# Patient Record
Sex: Male | Born: 1995 | Hispanic: No | Marital: Single | Smoking: Current every day smoker
Health system: Southern US, Community
[De-identification: ages and names within clinical notes are randomized; demographics above are authoritative.]

---
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
## 2018-07-08 ENCOUNTER — Emergency Department (HOSPITAL_COMMUNITY)
Admission: EM | Admit: 2018-07-08 | Discharge: 2018-07-08 | Disposition: A | Discharge status: Home / Self Care | Payer: Medicaid Other | Attending: Emergency Medicine | Admitting: Emergency Medicine

## 2018-07-08 ENCOUNTER — Encounter (HOSPITAL_COMMUNITY): Payer: Self-pay | Admitting: *Deleted

## 2018-07-08 DIAGNOSIS — L509 Urticaria, unspecified: Secondary | ICD-10-CM | POA: Insufficient documentation

## 2018-07-08 DIAGNOSIS — F17210 Nicotine dependence, cigarettes, uncomplicated: Secondary | ICD-10-CM | POA: Insufficient documentation

## 2018-07-08 DIAGNOSIS — R21 Rash and other nonspecific skin eruption: Secondary | ICD-10-CM | POA: Diagnosis present

## 2018-07-08 MED ORDER — EPINEPHRINE 0.3 MG/0.3ML IJ SOAJ: 0.3000 mg | Freq: Once | INTRAMUSCULAR | 0 refills | Status: AC

## 2018-07-08 MED ORDER — FAMOTIDINE 20 MG PO TABS
20.0000 mg | ORAL_TABLET | Freq: Once | ORAL | Status: AC
Start: 2018-07-08 — End: 2018-07-08
  Administered 2018-07-08: 20 mg via ORAL
  Filled 2018-07-08: qty 1

## 2018-07-08 MED ORDER — DIPHENHYDRAMINE HCL 25 MG PO CAPS
50.0000 mg | ORAL_CAPSULE | Freq: Once | ORAL | Status: AC
Start: 2018-07-08 — End: 2018-07-08
  Administered 2018-07-08: 50 mg via ORAL
  Filled 2018-07-08: qty 2

## 2018-07-08 MED ORDER — DIPHENHYDRAMINE HCL 25 MG PO TABS: 25.0000 mg | ORAL_TABLET | Freq: Four times a day (QID) | ORAL | 0 refills | Status: AC

## 2018-07-08 MED ORDER — PREDNISONE 20 MG PO TABS
60.0000 mg | ORAL_TABLET | Freq: Once | ORAL | Status: AC
  Administered 2018-07-08: 60 mg via ORAL
  Filled 2018-07-08: qty 3

## 2018-07-08 NOTE — ED Notes (Signed)
Pt verbalizes understanding of d/c instructions. Pt received prescriptions. Pt ambulatory at d/c with all belongings.  

## 2018-07-08 NOTE — ED Notes (Signed)
Pt stated that he feels better after meds that was givin

## 2018-07-08 NOTE — ED Provider Notes (Signed)
Patient placed in Quick Look pathway, seen and evaluated   Chief Complaint: Rash, scratchy throat  HPI:   Patient presents with rash, he is noticed some welts over his right wrist as well as the left side of his neck and across his face, he has not noticed any facial swelling, swelling of the tongue or lips, but reports his throat felt a little bit scratchy.  No difficulty breathing.  Notes a red place on his leg where he thinks he was stung by a bee at work.  No prior history of anaphylaxis, no meds prior to arrival.  ROS: + rash, sore throat. - Facial swelling, shortness of breath, lightheadedness Physical Exam:   Gen: No distress  Neuro: Awake and Alert  Skin: Warm    Focused Exam: Urticaria over the right wrist and left side of the neck, no facial swelling noted, posterior oropharynx is clear, no angioedema of the tongue or lips, no stridor, lungs clear to auscultation, normal phonation.  Erythema over the right inner ankle   Initiation of care has begun. The patient has been counseled on the process, plan, and necessity for staying for the completion/evaluation, and the remainder of the medical screening examination   Legrand RamsFord, Tajae Rybicki N, PA-C 07/08/18 1715    Azalia Bilisampos, Kevin, MD 07/08/18 2337

## 2018-07-08 NOTE — ED Triage Notes (Signed)
Pt reports being stung by a bee today at work st 14:20  with no known hx of allergies to bee stings and then had rash to face and arms onset today @ 14:50, pt denies SOB, pt airway intact, pt A&O x4

## 2018-07-08 NOTE — ED Provider Notes (Signed)
[facility] Provider Note   CSN: [id number] Arrival date & time: 07/08/18  1615   History   Chief Complaint Chief Complaint  Patient presents with  . Rash    HPI Thomas Morris is a 22 y.o. male.  HPI   22 year old male presents today with complaints of  Bee sting. Thomas Morris reports that he was at work today when a bee stung him on his right lower extremity. He notes swelling over the area, with a rash to his right wrist and tightness in his throat. He denies any difficulty breathing or swallowing. Patient notes he has been stung the past by bees and did not have an allergic reaction. Patient denies any other allergies. No medications prior to arrival.    History reviewed. No pertinent past medical history.  There are no active problems to display for this patient.   No past surgical history on file.      Home Medications    Prior to Admission medications   Medication Sig Start Date End Date Taking? Authorizing Provider  cephALEXin (KEFLEX) 500 MG capsule Take 1 capsule (500 mg total) by mouth 3 (three) times daily. For 7 days 03/17/14   Ree Shayeis, Jamie, MD  diphenhydrAMINE (BENADRYL) 25 MG tablet Take 1 tablet (25 mg total) by mouth every 6 (six) hours. 07/08/18   Erie Sica, Tinnie GensJeffrey, PA-C  EPINEPHrine (EPIPEN 2-PAK) 0.3 mg/0.3 mL IJ SOAJ injection Inject 0.3 mLs (0.3 mg total) into the muscle once for 1 dose. 07/08/18 07/08/18  Eyvonne MechanicHedges, Amberlee Garvey, PA-C    Family History No family history on file.  Social History Social History   Tobacco Use  . Smoking status: Current Every Day Smoker    Packs/day: 0.10    Types: Cigarettes  . Smokeless tobacco: Never Used  Substance Use Topics  . Alcohol use: Not Currently  . Drug use: Never     Allergies   Patient has no known allergies.   Review of Systems Review of Systems  All other systems reviewed and are negative.  Physical Exam Updated Vital Signs BP 127/64 (BP Location: Right  Arm)   Pulse 84   Temp 98.7 F (37.1 C) (Oral)   Resp 14   SpO2 97%   Physical Exam  Constitutional: He is oriented to person, place, and time. He appears well-developed and well-nourished.  HENT:  Head: Normocephalic and atraumatic.  Oropharynx is clear with no swelling or edema, no pooling of secretions  Eyes: Pupils are equal, round, and reactive to light. Conjunctivae are normal. Right eye exhibits no discharge. Left eye exhibits no discharge. No scleral icterus.  Neck: Normal range of motion. No JVD present. No tracheal deviation present.  Pulmonary/Chest: Effort normal and breath sounds normal. No stridor. No respiratory distress. He has no wheezes. He has no rales.  Musculoskeletal:  Hive noted over the right volar wrist, time noted over the right anterior shin, no stingers noted  Neurological: He is alert and oriented to person, place, and time. Coordination normal.  Psychiatric: He has a normal mood and affect. His behavior is normal. Judgment and thought content normal.  Nursing note and vitals reviewed.   ED Treatments / Results  Labs (all labs ordered are listed, but only abnormal results are displayed) Labs Reviewed - No data to display  EKG None  Radiology No results found.  Procedures Procedures (including critical care time)  Medications Ordered in ED Medications  diphenhydrAMINE (BENADRYL) capsule 50 mg (50 mg Oral Given 07/08/18  1709)  famotidine (PEPCID) tablet 20 mg (20 mg Oral Given 07/08/18 1709)  predniSONE (DELTASONE) tablet 60 mg (60 mg Oral Given 07/08/18 1709)     Initial Impression / Assessment and Plan / ED Course  I have reviewed the triage vital signs and the nursing notes.  Pertinent labs & imaging results that were available during my care of the patient were reviewed by me and considered in my medical decision making (see chart for details).     Labs:   Imaging:  Consults:  Therapeutics:Benadryl, Pepcid, prednisone  Discharge  Meds: Benadryl, EpiPen  Assessment/Plan: 22 year old male presents today with hives. No signs of anaphylactic reaction here. No history of allergic reaction to this. Patient discharged home on antihistamines. Patient will go home and shower cleansing the area of insect bite. He is given an EpiPen, I had discussion with him on appropriate use of epinephrine    Final Clinical Impressions(s) / ED Diagnoses   Final diagnoses:  Urticaria    ED Discharge Orders        Ordered    diphenhydrAMINE (BENADRYL) 25 MG tablet  Every 6 hours     07/08/18 2004    EPINEPHrine (EPIPEN 2-PAK) 0.3 mg/0.3 mL IJ SOAJ injection   Once     07/08/18 2005       Eyvonne Mechanic, Cordelia Poche 07/08/18 2144    Sabas Sous, MD 07/08/18 [phone]

## 2018-07-08 NOTE — Discharge Instructions (Addendum)
Please read attached information. If you experience any new or worsening signs or symptoms please return to the emergency room for evaluation. Please follow-up with your primary care provider or specialist as discussed. Please use medication prescribed only as directed and discontinue taking if you have any concerning signs or symptoms.   °

## 2018-08-02 ENCOUNTER — Emergency Department (HOSPITAL_COMMUNITY)
Admission: EM | Admit: 2018-08-02 | Discharge: 2018-08-03 | Disposition: A | Discharge status: Home / Self Care | Payer: Medicaid Other | Attending: Emergency Medicine | Admitting: Emergency Medicine

## 2018-08-02 ENCOUNTER — Other Ambulatory Visit: Payer: Self-pay

## 2018-08-02 ENCOUNTER — Emergency Department (HOSPITAL_COMMUNITY): Payer: Medicaid Other

## 2018-08-02 ENCOUNTER — Encounter (HOSPITAL_COMMUNITY): Payer: Self-pay | Admitting: Emergency Medicine

## 2018-08-02 DIAGNOSIS — R1110 Vomiting, unspecified: Secondary | ICD-10-CM | POA: Diagnosis not present

## 2018-08-02 DIAGNOSIS — Y92310 Basketball court as the place of occurrence of the external cause: Secondary | ICD-10-CM | POA: Diagnosis not present

## 2018-08-02 DIAGNOSIS — H538 Other visual disturbances: Secondary | ICD-10-CM | POA: Diagnosis not present

## 2018-08-02 DIAGNOSIS — Y998 Other external cause status: Secondary | ICD-10-CM | POA: Diagnosis not present

## 2018-08-02 DIAGNOSIS — S0232XA Fracture of orbital floor, left side, initial encounter for closed fracture: Secondary | ICD-10-CM

## 2018-08-02 DIAGNOSIS — W500XXA Accidental hit or strike by another person, initial encounter: Secondary | ICD-10-CM | POA: Diagnosis not present

## 2018-08-02 DIAGNOSIS — F17210 Nicotine dependence, cigarettes, uncomplicated: Secondary | ICD-10-CM | POA: Diagnosis not present

## 2018-08-02 DIAGNOSIS — Y9367 Activity, basketball: Secondary | ICD-10-CM | POA: Diagnosis not present

## 2018-08-02 DIAGNOSIS — S01112A Laceration without foreign body of left eyelid and periocular area, initial encounter: Secondary | ICD-10-CM | POA: Diagnosis not present

## 2018-08-02 DIAGNOSIS — S0993XA Unspecified injury of face, initial encounter: Secondary | ICD-10-CM | POA: Diagnosis present

## 2018-08-02 MED ORDER — OXYCODONE-ACETAMINOPHEN 5-325 MG PO TABS
1.0000 | ORAL_TABLET | ORAL | Status: DC | PRN
  Administered 2018-08-02: 1 via ORAL
  Filled 2018-08-02: qty 1

## 2018-08-02 NOTE — ED Provider Notes (Signed)
Patient placed in Quick Look pathway, seen and evaluated   Chief Complaint: eye pain  HPI:   Patient was elbowed in the left eye while playing basketball.  He reports that he has a cut to his left eye.  He reports that his vision is not normal.  ROS: No headache or LOC  Physical Exam:   Gen: No distress  Neuro: Awake and Alert  Skin: Warm    Focused Exam: 2cm laceration present on the upper left eyelid, does not appear to be through the lid.  When I is opened there is redness on the medial cornea.  Patient unable to participate in extraocular range of motion's testing secondary to pain.  He is able to count fingers.  Last tetanus 03/2014   Initiation of care has begun. The patient has been counseled on the process, plan, and necessity for staying for the completion/evaluation, and the remainder of the medical screening examination    Norman ClayHammond, Shi Blankenship W, PA-C 08/02/18 1914    Raeford RazorKohut, Stephen, MD 08/08/18 1430
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
## 2018-08-03 ENCOUNTER — Encounter (HOSPITAL_COMMUNITY): Payer: Self-pay | Admitting: Student

## 2018-08-03 MED ORDER — OXYCODONE-ACETAMINOPHEN 5-325 MG PO TABS: 1.0000 | ORAL_TABLET | Freq: Four times a day (QID) | ORAL | 0 refills | Status: AC | PRN

## 2018-08-03 MED ORDER — TETRACAINE HCL 0.5 % OP SOLN
2.0000 [drp] | Freq: Once | OPHTHALMIC | Status: AC
  Administered 2018-08-03: 2 [drp] via OPHTHALMIC
  Filled 2018-08-03: qty 4

## 2018-08-03 MED ORDER — LIDOCAINE HCL (PF) 1 % IJ SOLN
5.0000 mL | Freq: Once | INTRAMUSCULAR | Status: AC
  Administered 2018-08-03: 5 mL
  Filled 2018-08-03: qty 5

## 2018-08-03 MED ORDER — FLUORESCEIN SODIUM 1 MG OP STRP
1.0000 | ORAL_STRIP | Freq: Once | OPHTHALMIC | Status: AC
Start: 2018-08-03 — End: 2018-08-03
  Administered 2018-08-03: 1 via OPHTHALMIC
  Filled 2018-08-03: qty 1

## 2018-08-03 NOTE — Discharge Instructions (Addendum)
You were seen in the emergency department today for an injury to your eye.  Your CT scan does show a fracture of the lower portion of your eye bone, otherwise noticed an orbital floor fracture.  The laceration to the upper portion of your eyelid was repaired with 3 stitches, these are absorbable, they will absorb on their own and will not need to be removed.  We would like you to follow-up with an ear nose and throat doctor who specializes in these types of fractures.  Please call the office providing her discharge instructions on Monday to set up an appointment within the next few days.  Return to the ER sooner for new or worsening symptoms including but not limited to inability to move your eyes, purulent drainage from the eye, increased pain, or change in vision, purulent drainage from the laceration, redness around the laceration, fever, significant swelling, vomiting, or any other concerns that you may have.  We have given you a prescription for Percocet for severe pain. -Percocet-this is a narcotic/controlled substance medication that has potential addicting qualities.  We recommend that you take 1-2 tablets every 6 hours as needed for severe pain.  Do not drive or operate heavy machinery when taking this medicine as it can be sedating. Do not drink alcohol or take other sedating medications when taking this medicine for safety reasons.  Keep this out of reach of small children.  Please be aware this medicine has Tylenol in it (325 mg/tab) do not exceed the maximum dose of Tylenol in a day per over the counter recommendations should you decide to supplement with Tylenol over the counter.   We have prescribed you new medication(s) today. Discuss the medications prescribed today with your pharmacist as they can have adverse effects and interactions with your other medicines including over the counter and prescribed medications. Seek medical evaluation if you start to experience new or abnormal symptoms after  taking one of these medicines, seek care immediately if you start to experience difficulty breathing, feeling of your throat closing, facial swelling, or rash as these could be indications of a more serious allergic reaction

## 2018-08-03 NOTE — ED Notes (Signed)
PA at bedside performing eye examination .

## 2018-08-03 NOTE — ED Provider Notes (Signed)
[facility] Provider Note   CSN: [id number] Arrival date & time: 08/02/18  1858     History   Chief Complaint Chief Complaint  Patient presents with  . Eye Pain  . Facial Swelling    HPI Thomas Morris is a 22 y.o. male with a hx of tobacco abuse who presents to the ED for L eye injury which occurred around 1400 this afternoon.  Patient states that he was playing basketball when another player accidentally elbowed him in the left eye.  He had fairly quick onset of pain, pain is most prominent to the eye and just below the eye.  Current discomfort is an 8 out of 10 in severity.  He was given Percocet in triage, however he has not had anything to eat today, and resultantly had an episode of emesis very shortly after.  Patient reports associated laceration to the left eyelid as well as some mild blurry vision in the left eye.  There are no specific alleviating or aggravating factors.  He did not have any loss of consciousness.  He is not a contact lens wearer.  He was not feeling nauseous or having vomiting until having Percocet, no other episodes of emesis.  Denies fever, numbness, weakness, loss of consciousness.  He reports last tetanus vaccination was in April of this year.    HPI  History reviewed. No pertinent past medical history.  There are no active problems to display for this patient.   History reviewed. No pertinent surgical history.      Home Medications    Prior to Admission medications   Medication Sig Start Date End Date Taking? Authorizing Provider  cephALEXin (KEFLEX) 500 MG capsule Take 1 capsule (500 mg total) by mouth 3 (three) times daily. For 7 days 03/17/14   Ree Shayeis, Jamie, MD  diphenhydrAMINE (BENADRYL) 25 MG tablet Take 1 tablet (25 mg total) by mouth every 6 (six) hours. 07/08/18   Eyvonne MechanicHedges, Jeffrey, PA-C    Family History No family history on file.  Social History Social History   Tobacco Use  . Smoking  status: Current Every Day Smoker    Packs/day: 0.10    Types: Cigarettes  . Smokeless tobacco: Never Used  Substance Use Topics  . Alcohol use: Not Currently  . Drug use: Never     Allergies   Patient has no known allergies.   Review of Systems Review of Systems  All other systems reviewed and are negative.    Physical Exam Updated Vital Signs BP 123/63 (BP Location: Right Arm)   Pulse 70   Temp 98.7 F (37.1 C) (Oral)   Resp 14   Ht 5\' 7"  (1.702 m)   Wt 78.5 kg   SpO2 100%   BMI 27.10 kg/m   Physical Exam  Constitutional: He is oriented to person, place, and time. He appears well-developed and well-nourished. No distress.  HENT:  Right Ear: Tympanic membrane normal. No drainage. No hemotympanum.  Left Ear: Tympanic membrane normal. No drainage. No hemotympanum.  Nose: Nose normal. No mucosal edema, nasal deformity, septal deviation or nasal septal hematoma. No epistaxis.  Mouth/Throat: Uvula is midline and oropharynx is clear and moist.  Eyes: Right eye exhibits no discharge. Left eye exhibits no discharge.  Patient exhibits left periorbital swelling and ecchymosis.  There is a fairly superficial area of abrasion to the inferior periorbital region.  Patient has a 2.25 cm laceration just inferior to the left brow on the lid, fairly  superficial, does not appear to go through the lid.  Patient is tender to the inferior periorbital area.  No other areas of facial tenderness to palpation.  His pupils are equal round and reactive to light.  His extraocular movements are intact without nystagmus.  He does have a medial left conjunctival hemorrhage.  No appreciable hyphema or hypopyon.  No significant fluorescein uptake on exam to raise concern for corneal abrasion. Negative seidel sign.  His IOP in the left eye is 16. Visual acuity: Bilateral: 10/12.5  R Near: 10/12.5 L Near: 10/12.5  Cardiovascular: Normal rate and regular rhythm.  No murmur heard. Pulmonary/Chest: Effort  normal and breath sounds normal. No respiratory distress.  Abdominal: Soft. He exhibits no distension.  Neurological: He is alert and oriented to person, place, and time.  Clear speech.   Psychiatric: He has a normal mood and affect. His behavior is normal. Thought content normal.  Nursing note and vitals reviewed.    ED Treatments / Results  Labs (all labs ordered are listed, but only abnormal results are displayed) Labs Reviewed - No data to display  EKG None  Radiology Ct Orbitss W/o Cm  Result Date: 08/02/2018 CLINICAL DATA:  Elbowed in the left orbital region while playing basketball. Pain with movement of the left eye. EXAM: CT ORBITS WITHOUT CONTRAST TECHNIQUE: Multidetector CT images were obtained using the standard protocol without intravenous contrast. COMPARISON:  None. FINDINGS: Orbits: There is a fracture of the floor of the left orbit with a trapdoor fragment flipped medially on image 29/5, and with herniation of orbital adipose tissue and partial herniation of the inferior margin of the inferior rectus muscle into the resulting defect as shown on image 56/8 and image 28/7. No discrete fracture of the orbital rim. As expected, there is a small amount of fluid in the left maxillary sinus. The zygomatic arch is intact. Visualized sinuses: There is opacification of a inferolateral ethmoid air cell adjacent to the orbital fracture as shown on image 29/5. As noted above there is some fluid in the left maxillary sinus which is not unexpected due to the fracture. The paranasal sinuses appear otherwise clear. Soft tissues: Left periorbital and in particular infraorbital subcutaneous hematoma/soft tissue swelling. Limited intracranial: Unremarkable IMPRESSION: 1. Left orbital floor fracture with trapdoor fragment. Mild herniation of orbital adipose tissue and partial herniation of the inferior rectus muscle into the resulting defect. There is a small amount of blood in the left maxillary  sinus and potentially in an adjacent left ethmoid air cell. No appreciable fracture of the orbital rim or of the zygomatic arch. 2. Left periorbital subcutaneous hematoma/soft tissue swelling. Electronically Signed   By: Gaylyn Rong M.D.   On: 08/02/2018 19:51    Procedures .Marland KitchenLaceration Repair Date/Time: 08/03/2018 3:22 AM Performed by: Cherly Anderson, PA-C Authorized by: Cherly Anderson, PA-C   Consent:    Consent obtained:  Verbal   Consent given by:  Patient   Risks discussed:  Infection, nerve damage, need for additional repair, pain, poor cosmetic result, poor wound healing, vascular damage, tendon damage and retained foreign body   Alternatives discussed:  No treatment Anesthesia (see MAR for exact dosages):    Anesthesia method:  Local infiltration   Local anesthetic:  Lidocaine 1% w/o epi Laceration details:    Location:  Face   Face location:  L upper eyelid   Extent:  Partial thickness   Length (cm):  2.3 Repair type:    Repair type:  Simple Pre-procedure  details:    Preparation:  Patient was prepped and draped in usual sterile fashion and imaging obtained to evaluate for foreign bodies Exploration:    Hemostasis achieved with:  Direct pressure   Wound exploration: wound explored through full range of motion and entire depth of wound probed and visualized     Contaminated: no   Treatment:    Area cleansed with:  Betadine   Amount of cleaning:  Standard   Irrigation solution:  Sterile water   Irrigation method:  Pressure wash Skin repair:    Repair method:  Sutures   Suture size:  5-0   Wound skin closure material used: vicryl rapide.   Suture technique:  Simple interrupted   Number of sutures:  3 Approximation:    Approximation:  Close Post-procedure details:    Dressing:  Antibiotic ointment   Patient tolerance of procedure:  Tolerated well, no immediate complications   (including critical care time)  Medications Ordered in  ED Medications  oxyCODONE-acetaminophen (PERCOCET/ROXICET) 5-325 MG per tablet 1 tablet (1 tablet Oral Given 08/02/18 2031)  fluorescein ophthalmic strip 1 strip (1 strip Left Eye Given 08/03/18 0125)  tetracaine (PONTOCAINE) 0.5 % ophthalmic solution 2 drop (2 drops Left Eye Given 08/03/18 0124)     Initial Impression / Assessment and Plan / ED Course  I have reviewed the triage vital signs and the nursing notes.  Pertinent labs & imaging results that were available during my care of the patient were reviewed by me and considered in my medical decision making (see chart for details).   Patient presents to the emergency department status post left eye trauma with complaints of discomfort, laceration, and mild blurry vision.  Patient nontoxic-appearing, vitals upon my assessment within normal limits.  Resting comfortably.  Detailed exam as above.  CT scan of the orbits per triage reveals left orbital floor fracture with trapdoor fragment, there is mild herniation of orbital adipose tissue and partial herniation of inferior rectus muscle into the resulting defect.  Additionally small amount of blood in the left maxillary sinus and potentially in the adjacent left ethmoid air cells.  Patient's extraocular movements are intact, no findings to raise concern for entrapment on exam.  Physical exam findings do not raise concern for globe rupture.  No evidence of corneal abrasion or hyphema. Visual acuity symmetric. IOP WNL. Patient does have a subconjunctival hemorrhage. Laceration is not directly over fracture. No exposed bone. Laceration was repaired per procedure note above, his tetanus is up-to-date, suture home care discussed with the patient and his mother at bedside.Patient will require follow-up with maxillofacial trauma service, [facility] ENT covering. I discussed results, treatment plan, need for follow-up, and strict return precautions with the patient. Provided opportunity for questions, patient  confirmed understanding and is in agreement with plan.   Findings and plan of care discussed with supervising physician Dr. Preston Fleeting who is in agreement  Final Clinical Impressions(s) / ED Diagnoses   Final diagnoses:  Closed fracture of left orbital floor, initial encounter [facility])  Left eyelid laceration, initial encounter    ED Discharge Orders         Ordered    oxyCODONE-acetaminophen (PERCOCET/ROXICET) 5-325 MG tablet  Every 6 hours PRN     08/03/18 0331           Cherly Anderson, PA-C 08/03/18 0704    Dione Booze, MD 08/03/18 [phone]

## 2019-04-06 IMAGING — CT CT ORBITS W/O CM
3 of 4 series · 14 of 47 positions shown, 16 images · non-contrast
Comparison: None.

CLINICAL DATA: Elbowed in the left orbital region while playing
basketball. Pain with movement of the left eye.

EXAM:
CT ORBITS WITHOUT CONTRAST
TECHNIQUE: Multidetector CT images were obtained using the standard protocol
without intravenous contrast.

[Series 3: facialbone 2.0 st · axial · 0.32mm/px · z∈[+985,+1079]mm · 8 of 55 slices shown, 10 images]
[im 4/55  brain]
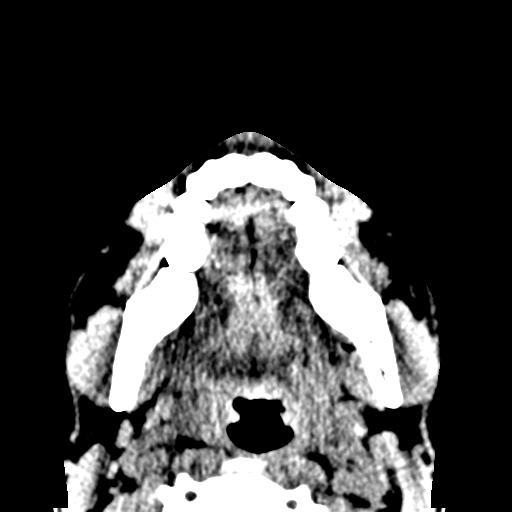
[im 4/55  bone]
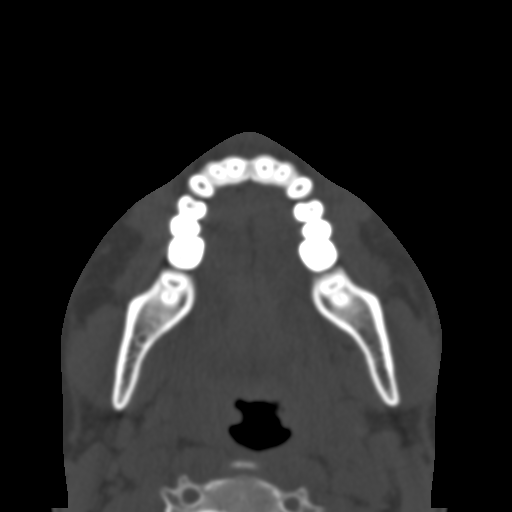
[im 12/55  bone]
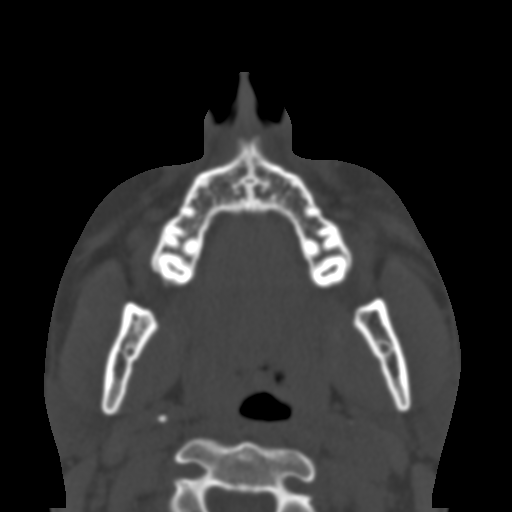
[im 17/55  bone]
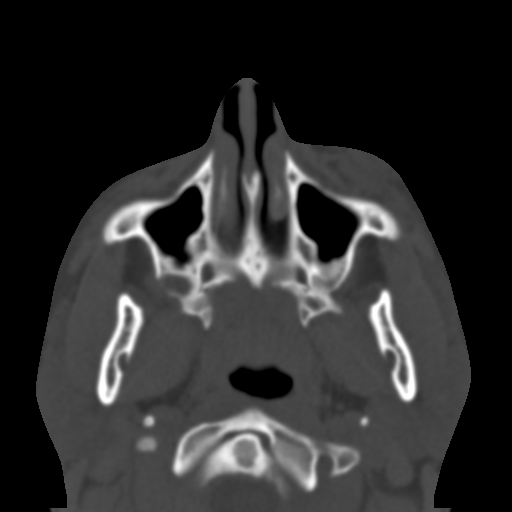
[im 25/55  bone]
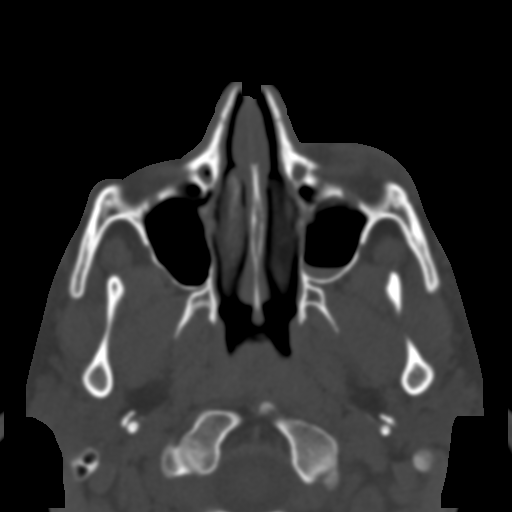
[im 30/55  brain]
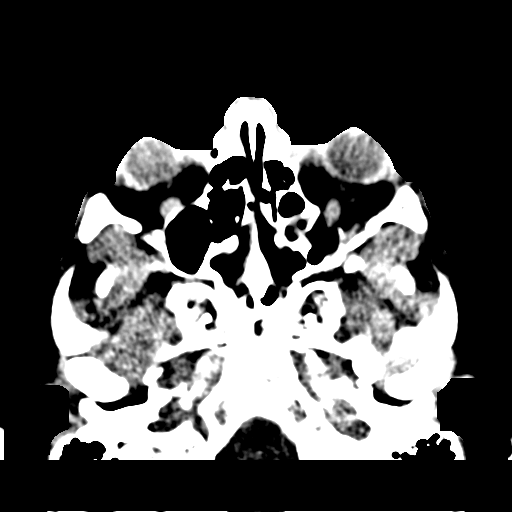
[im 30/55  bone]
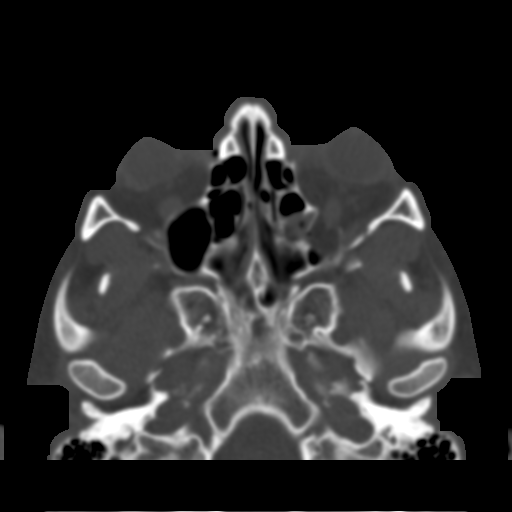
[im 38/55  bone]
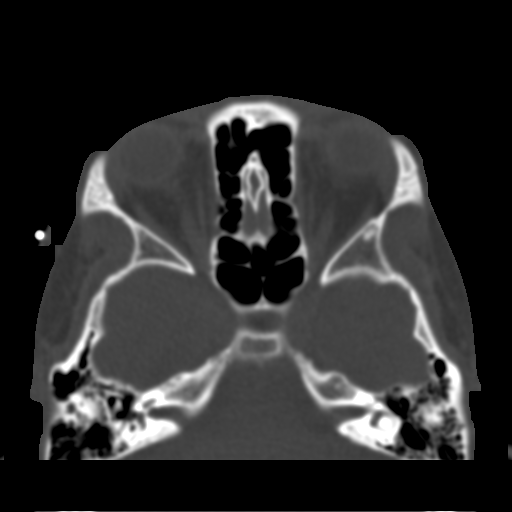
[im 43/55  bone]
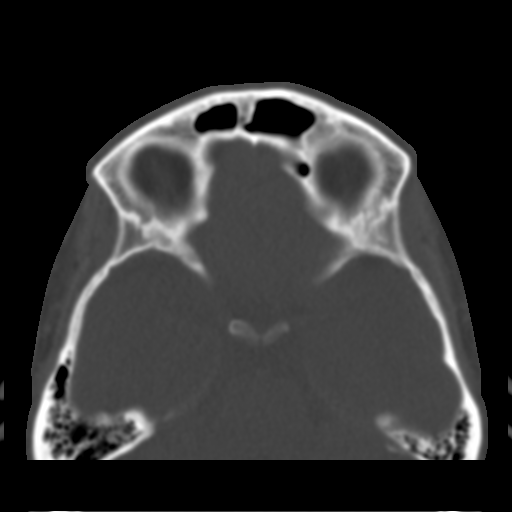
[im 51/55  bone]
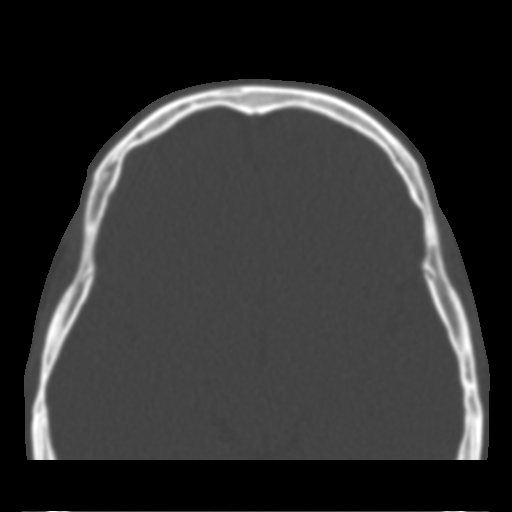

[Series 7: facialbone 2.0 cor st · coronal · 0.23mm/px · 3 of 72 slices shown]
[im 18/72  bone]
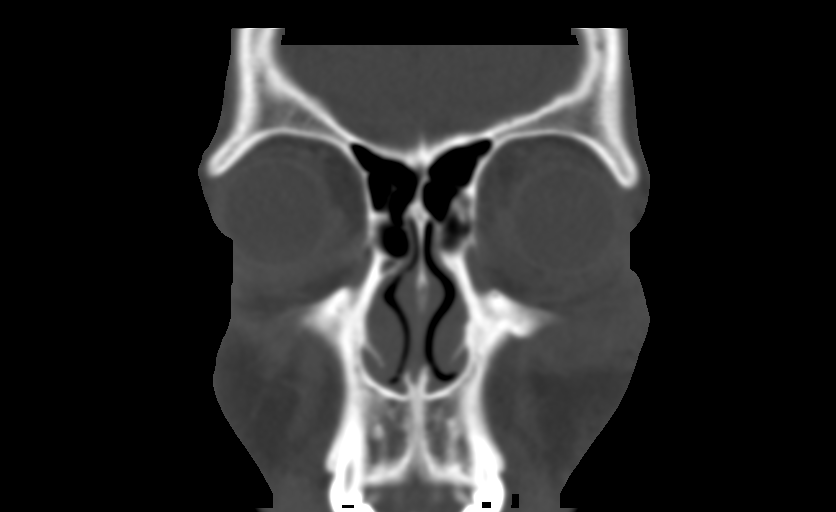
[im 36/72  bone]
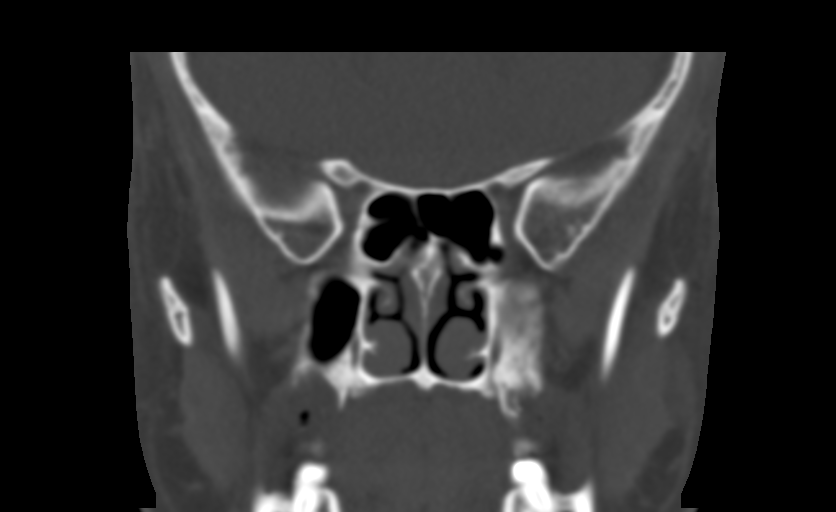
[im 54/72  bone]
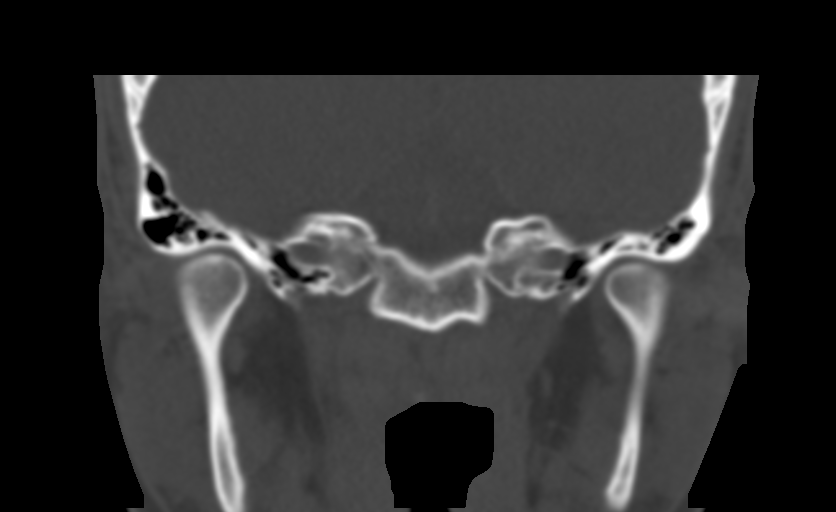

[Series 8: facialbone 2.0 sag st · sagittal · 0.22mm/px · 3 of 84 slices shown]
[im 28/84  bone]
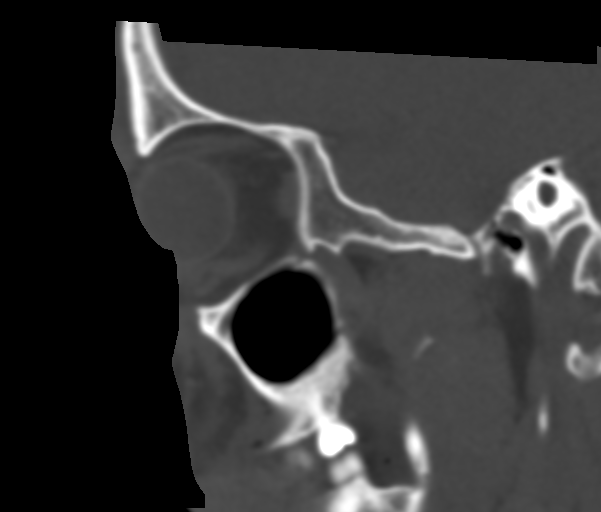
[im 42/84  bone]
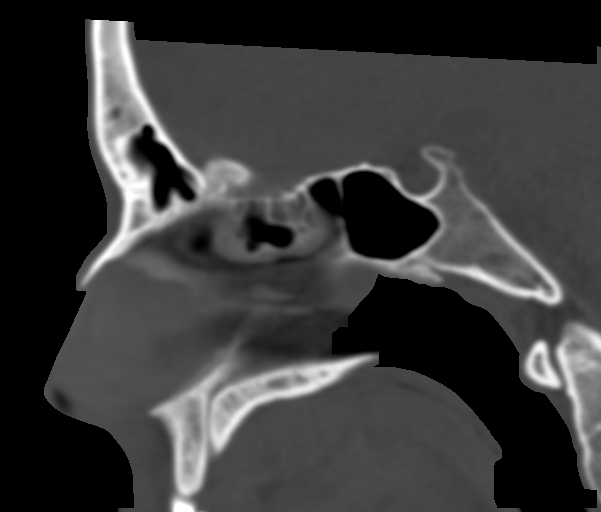
[im 56/84  bone]
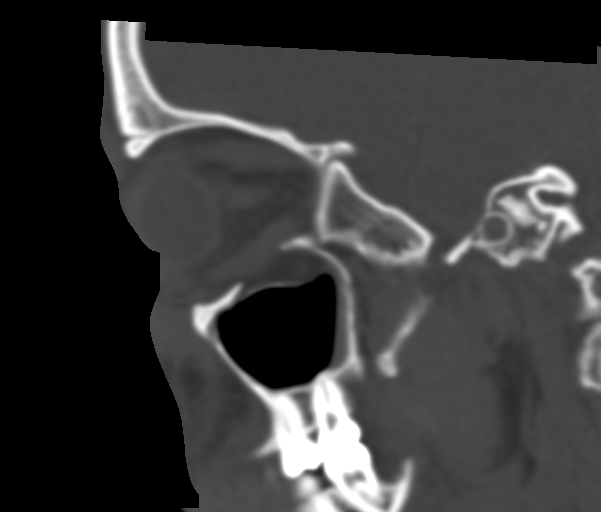

[14 of 47 positions shown; findings below may reference images not displayed]

FINDINGS: Orbits: There is a fracture of the floor of the left orbit with a
trapdoor fragment flipped medially on image [DATE], and with
herniation of orbital adipose tissue and partial herniation of the
inferior margin of the inferior rectus muscle into the resulting
defect as shown on image 56/8 and image [DATE]. No discrete fracture
of the orbital rim.

As expected, there is a small amount of fluid in the left maxillary
sinus. The zygomatic arch is intact.

Visualized sinuses: There is opacification of a inferolateral
ethmoid air cell adjacent to the orbital fracture as shown on image
[DATE]. As noted above there is some fluid in the left maxillary sinus
which is not unexpected due to the fracture. The paranasal sinuses
appear otherwise clear.

Soft tissues: Left periorbital and in particular infraorbital
subcutaneous hematoma/soft tissue swelling.

Limited intracranial: Unremarkable
IMPRESSION: 1. Left orbital floor fracture with trapdoor fragment. Mild
herniation of orbital adipose tissue and partial herniation of the
inferior rectus muscle into the resulting defect. There is a small
amount of blood in the left maxillary sinus and potentially in an
adjacent left ethmoid air cell. No appreciable fracture of the
orbital rim or of the zygomatic arch.
2. Left periorbital subcutaneous hematoma/soft tissue swelling.

## 2021-03-02 ENCOUNTER — Emergency Department (HOSPITAL_COMMUNITY): Payer: Medicaid Other

## 2021-03-02 ENCOUNTER — Emergency Department (HOSPITAL_COMMUNITY)
Admission: EM | Admit: 2021-03-02 | Discharge: 2021-03-02 | Disposition: A | Discharge status: Home / Self Care | Payer: Medicaid Other | Attending: Emergency Medicine | Admitting: Emergency Medicine

## 2021-03-02 ENCOUNTER — Other Ambulatory Visit: Payer: Self-pay

## 2021-03-02 ENCOUNTER — Encounter (HOSPITAL_COMMUNITY): Payer: Self-pay | Admitting: *Deleted

## 2021-03-02 DIAGNOSIS — N50811 Right testicular pain: Secondary | ICD-10-CM | POA: Insufficient documentation

## 2021-03-02 DIAGNOSIS — R4689 Other symptoms and signs involving appearance and behavior: Secondary | ICD-10-CM

## 2021-03-02 DIAGNOSIS — F17210 Nicotine dependence, cigarettes, uncomplicated: Secondary | ICD-10-CM | POA: Insufficient documentation

## 2021-03-02 LAB — URINALYSIS, ROUTINE W REFLEX MICROSCOPIC
Bilirubin Urine: NEGATIVE
Glucose, UA: NEGATIVE mg/dL
Hgb urine dipstick: NEGATIVE
Ketones, ur: NEGATIVE mg/dL
Leukocytes,Ua: NEGATIVE
Nitrite: NEGATIVE
Protein, ur: NEGATIVE mg/dL
Specific Gravity, Urine: 1.026 (ref 1.005–1.030)
pH: 6 (ref 5.0–8.0)

## 2021-03-02 NOTE — ED Provider Notes (Signed)
[facility] EMERGENCY DEPARTMENT Provider Note   CSN: [id number] Arrival date & time: 03/02/21  1901     History Chief Complaint  Patient presents with  . Testicle Pain    Thomas Morris is a 25 y.o. male who presents to ED with a chief complaint of right testicle pain and "flattening."  States that the symptoms have been going on for the past several years but pain is more prominent lately.  States that he has never been evaluated for this but states that "my girlfriend wanted me to get checked out. I'm just worried about my testicle."  No specific trigger that causes the symptoms.  Denies any penile discharge, dysuria, fever, abdominal pain, injury or trauma.  HPI     History reviewed. No pertinent past medical history.  There are no problems to display for this patient.   History reviewed. No pertinent surgical history.     No family history on file.  Social History   Tobacco Use  . Smoking status: Current Every Day Smoker    Packs/day: 0.10    Types: Cigarettes  . Smokeless tobacco: Never Used  Vaping Use  . Vaping Use: Every day  Substance Use Topics  . Alcohol use: Yes  . Drug use: Yes    Types: Marijuana    Home Medications Prior to Admission medications   Medication Sig Start Date End Date Taking? Authorizing Provider  cephALEXin (KEFLEX) 500 MG capsule Take 1 capsule (500 mg total) by mouth 3 (three) times daily. For 7 days 03/17/14   Ree Shay, MD  diphenhydrAMINE (BENADRYL) 25 MG tablet Take 1 tablet (25 mg total) by mouth every 6 (six) hours. 07/08/18   Hedges, Tinnie Gens, PA-C  oxyCODONE-acetaminophen (PERCOCET/ROXICET) 5-325 MG tablet Take 1-2 tablets by mouth every 6 (six) hours as needed for severe pain. 08/03/18   Petrucelli, Pleas Koch, PA-C    Allergies    Patient has no known allergies.  Review of Systems   Review of Systems  Constitutional: Negative for appetite change, chills and fever.  HENT: Negative for ear pain,  rhinorrhea, sneezing and sore throat.   Eyes: Negative for photophobia and visual disturbance.  Respiratory: Negative for cough, chest tightness, shortness of breath and wheezing.   Cardiovascular: Negative for chest pain and palpitations.  Gastrointestinal: Negative for abdominal pain, blood in stool, constipation, diarrhea, nausea and vomiting.  Genitourinary: Positive for testicular pain. Negative for dysuria, hematuria, penile swelling, scrotal swelling and urgency.  Musculoskeletal: Negative for myalgias.  Skin: Negative for rash.  Neurological: Negative for dizziness, weakness and light-headedness.    Physical Exam Updated Vital Signs BP 129/89 (BP Location: Left Arm)   Pulse 85   Temp 98.5 F (36.9 C) (Oral)   Resp 17   SpO2 98%   Physical Exam Vitals and nursing note reviewed. Exam conducted with a chaperone present.  Constitutional:      General: He is not in acute distress.    Appearance: He is well-developed.  HENT:     Head: Normocephalic and atraumatic.     Nose: Nose normal.  Eyes:     General: No scleral icterus.       Right eye: No discharge.        Left eye: No discharge.     Conjunctiva/sclera: Conjunctivae normal.  Cardiovascular:     Rate and Rhythm: Normal rate and regular rhythm.     Heart sounds: Normal heart sounds. No murmur heard. No friction rub. No gallop.  Pulmonary:     Effort: Pulmonary effort is normal. No respiratory distress.     Breath sounds: Normal breath sounds.  Abdominal:     General: Bowel sounds are normal. There is no distension.     Palpations: Abdomen is soft.     Tenderness: There is no abdominal tenderness. There is no guarding.  Genitourinary:    Penis: Uncircumcised.      Comments: No tenderness noted of bilateral testicles.  No overlying skin changes.  No edema or warmth noted.  Cremasteric reflex intact bilaterally. Musculoskeletal:        General: Normal range of motion.     Cervical back: Normal range of motion and  neck supple.  Skin:    General: Skin is warm and dry.     Findings: No rash.  Neurological:     Mental Status: He is alert.     Motor: No abnormal muscle tone.     Coordination: Coordination normal.     ED Results / Procedures / Treatments   Labs (all labs ordered are listed, but only abnormal results are displayed) Labs Reviewed  URINALYSIS, ROUTINE W REFLEX MICROSCOPIC    EKG None  Radiology US SCROTUM W/DOPPLER  Result Date: 03/02/2021 CLINICAL DATA:  Pain EXAM: SCROTAL ULTRASOUND DOPPLER ULTRASOUND OF THE TESTICLES TECHNIQUE: Complete ultrasound examination of the testicles, epididymis, and other scrotal structures was performed. Color and spectral Doppler ultrasound were also utilized to evaluate blood flow to the testicles. COMPARISON:  None. FINDINGS: Right testicle Measurements: 4.1 x 2.4 x 2.9 cm. No mass or microlithiasis visualized. Left testicle Measurements: 4.3 x 2.3 x 3.0 cm. No mass or microlithiasis visualized. Right epididymis:  Normal in size and appearance. Left epididymis:  Normal in size and appearance. Hydrocele:  None visualized. Varicocele:  None visualized. Pulsed Doppler interrogation of both testes demonstrates normal low resistance arterial and venous waveforms bilaterally. IMPRESSION: 1. Unremarkable scrotal ultrasound. Electronically Signed   By: Sharlet Salina M.D.   On: 03/02/2021 22:50    Procedures Procedures   Medications Ordered in ED Medications - No data to display  ED Course  I have reviewed the triage vital signs and the nursing notes.  Pertinent labs & imaging results that were available during my care of the patient were reviewed by me and considered in my medical decision making (see chart for details).    MDM Rules/Calculators/A&P                          25 year old male presenting to the ED with a chief complaint of right testicle pain and "flattening" sensation for the past several years but pain is more prominent lately.   States that he has never been evaluated for this but states that he is "worried about my testicle."  Denies any specific trigger that causes the pain or flattening.  No penile discharge, dysuria, fever, abdominal pain, injury or trauma.  On physical exam patient without any edema, erythema or tenderness noted of bilateral testicles.  Visual inspection without any abnormalities.  Normal reflexes.  He is afebrile here.  Urinalysis without evidence of infection.  Scrotal ultrasound without any abnormalities.  I am unsure what exactly caused his symptoms over the past several years but there are no emergent abnormalities noted on imaging or on urinalysis.  I will have him follow-up and establish care with a primary care provider patient is agreeable to the plan.  Return precautions given.   Patient is  hemodynamically stable, in NAD, and able to ambulate in the ED. Evaluation does not show pathology that would require ongoing emergent intervention or inpatient treatment. I explained the diagnosis to the patient. Pain has been managed and has no complaints prior to discharge. Patient is comfortable with above plan and is stable for discharge at this time. All questions were answered prior to disposition. Strict return precautions for returning to the ED were discussed. Encouraged follow up with PCP.   An After Visit Summary was printed and given to the patient.   Portions of this note were generated with Scientist, clinical (histocompatibility and immunogenetics). Dictation errors may occur despite best attempts at proofreading.  Final Clinical Impression(s) / ED Diagnoses Final diagnoses:  Concern with appearance    Rx / DC Orders ED Discharge Orders    None       Dietrich Pates, PA-C 03/02/21 2257    Jacalyn Lefevre, MD 03/05/21 [phone]

## 2021-03-02 NOTE — ED Notes (Signed)
Patient transported to Ultrasound 

## 2021-03-02 NOTE — Discharge Instructions (Signed)
Your ultrasound did not show any abnormal findings. Your urine did not show any signs of infection or other abnormalities. It is important for you to establish care with a primary care provider listed below. Return to the ER if you start to experience worsening pain, swelling, injuries, fever.

## 2021-03-02 NOTE — ED Triage Notes (Signed)
Pt reports R testicle is flat and is concerned. Denies dysuria or pain with sex. Also reports unable to eat for 2 weeks, denies NV

## 2021-04-01 ENCOUNTER — Encounter (HOSPITAL_COMMUNITY): Payer: Self-pay

## 2021-04-01 ENCOUNTER — Emergency Department (HOSPITAL_COMMUNITY): Payer: Medicaid Other

## 2021-04-01 ENCOUNTER — Emergency Department (HOSPITAL_COMMUNITY)
Admission: EM | Admit: 2021-04-01 | Discharge: 2021-04-02 | Disposition: A | Discharge status: Home / Self Care | Payer: Medicaid Other | Attending: Emergency Medicine | Admitting: Emergency Medicine

## 2021-04-01 DIAGNOSIS — F1910 Other psychoactive substance abuse, uncomplicated: Secondary | ICD-10-CM

## 2021-04-01 DIAGNOSIS — F17210 Nicotine dependence, cigarettes, uncomplicated: Secondary | ICD-10-CM | POA: Insufficient documentation

## 2021-04-01 DIAGNOSIS — T50901A Poisoning by unspecified drugs, medicaments and biological substances, accidental (unintentional), initial encounter: Secondary | ICD-10-CM | POA: Insufficient documentation

## 2021-04-01 LAB — CBC WITH DIFFERENTIAL/PLATELET
Abs Immature Granulocytes: 0.02 10*3/uL (ref 0.00–0.07)
Basophils Absolute: 0.1 10*3/uL (ref 0.0–0.1)
Basophils Relative: 1 %
Eosinophils Absolute: 0.3 10*3/uL (ref 0.0–0.5)
Eosinophils Relative: 3 %
HCT: 41.9 % (ref 39.0–52.0)
Hemoglobin: 14.2 g/dL (ref 13.0–17.0)
Immature Granulocytes: 0 %
Lymphocytes Relative: 24 %
Lymphs Abs: 1.9 10*3/uL (ref 0.7–4.0)
MCH: 31.5 pg (ref 26.0–34.0)
MCHC: 33.9 g/dL (ref 30.0–36.0)
MCV: 92.9 fL (ref 80.0–100.0)
Monocytes Absolute: 0.7 10*3/uL (ref 0.1–1.0)
Monocytes Relative: 9 %
Neutro Abs: 4.9 10*3/uL (ref 1.7–7.7)
Neutrophils Relative %: 63 %
Platelets: 177 10*3/uL (ref 150–400)
RBC: 4.51 MIL/uL (ref 4.22–5.81)
RDW: 12.5 % (ref 11.5–15.5)
WBC: 7.9 10*3/uL (ref 4.0–10.5)
nRBC: 0 % (ref 0.0–0.2)

## 2021-04-01 LAB — BASIC METABOLIC PANEL
Anion gap: 9 (ref 5–15)
BUN: 10 mg/dL (ref 6–20)
CO2: 25 mmol/L (ref 22–32)
Calcium: 9.1 mg/dL (ref 8.9–10.3)
Chloride: 102 mmol/L (ref 98–111)
Creatinine, Ser: 0.67 mg/dL (ref 0.61–1.24)
GFR, Estimated: >60 mL/min (ref >60)
Glucose, Bld: 93 mg/dL (ref 70–99)
Potassium: 3.5 mmol/L (ref 3.5–5.1)
Sodium: 136 mmol/L (ref 135–145)

## 2021-04-01 LAB — ACETAMINOPHEN LEVEL: Acetaminophen (Tylenol), Serum: <10 ug/mL — ABNORMAL LOW (ref 10–30)

## 2021-04-01 LAB — SALICYLATE LEVEL: Salicylate Lvl: <7 mg/dL — ABNORMAL LOW (ref 7.0–30.0)

## 2021-04-01 LAB — ETHANOL: Alcohol, Ethyl (B): <10 mg/dL (ref <10)

## 2021-04-01 NOTE — ED Notes (Signed)
Pt still drowsy and difficult to arouse

## 2021-04-01 NOTE — ED Triage Notes (Signed)
Pt came from home via EMS. Per EMS, pt took 2 xanax bars laced with fentanyl. Found unresponsive by family, family gave 2 doses of 4 mg IM narcan. Pt able to answer questions upon EMS arrival to scene. Pt arousable to pain. RR: 20  126/66 CBG: 91 18 in Left AC 67 HR

## 2021-04-01 NOTE — ED Provider Notes (Signed)
[facility]-EMERGENCY DEPT Provider Note   CSN: [id number] Arrival date & time: 04/01/21  1900     History Chief Complaint  Patient presents with  . Drug Overdose    Thomas Morris is a 25 y.o. male.  26 year old male with prior medical history as detailed below presents via EMS for evaluation.  Patient with reported overdose at home.  Patient reports that he was taking Ativan and fentanyl and attempt to get high.  Patient was found to be minimally responsive by family.  He was given a total of 8 mg of Narcan by family.  EMS reports that he was somnolent but arousable during transport.  Patient was not noted to be hypoxic.  Level 5 caveat secondary to patient's altered mental status secondary to reported drug use.  The history is provided by the patient and medical records.  Drug Overdose This is a new problem. The current episode started less than 1 hour ago. The problem has not changed since onset.Pertinent negatives include no chest pain and no abdominal pain. Nothing aggravates the symptoms. Nothing relieves the symptoms.       History reviewed. No pertinent past medical history.  There are no problems to display for this patient.   History reviewed. No pertinent surgical history.     History reviewed. No pertinent family history.  Social History   Tobacco Use  . Smoking status: Current Every Day Smoker    Packs/day: 0.10    Types: Cigarettes  . Smokeless tobacco: Never Used  Vaping Use  . Vaping Use: Every day  Substance Use Topics  . Alcohol use: Yes  . Drug use: Yes    Types: Marijuana    Home Medications Prior to Admission medications   Medication Sig Start Date End Date Taking? Authorizing Provider  cephALEXin (KEFLEX) 500 MG capsule Take 1 capsule (500 mg total) by mouth 3 (three) times daily. For 7 days 03/17/14   Ree Shay, MD  diphenhydrAMINE (BENADRYL) 25 MG tablet Take 1 tablet (25 mg total) by mouth every 6 (six)  hours. 07/08/18   Hedges, Tinnie Gens, PA-C  oxyCODONE-acetaminophen (PERCOCET/ROXICET) 5-325 MG tablet Take 1-2 tablets by mouth every 6 (six) hours as needed for severe pain. 08/03/18   Petrucelli, Pleas Koch, PA-C    Allergies    Patient has no known allergies.  Review of Systems   Review of Systems  Unable to perform ROS: Acuity of condition  Cardiovascular: Negative for chest pain.  Gastrointestinal: Negative for abdominal pain.    Physical Exam Updated Vital Signs BP 113/75 (BP Location: Right Arm)   Pulse 69   Temp 98 F (36.7 C) (Oral)   Resp 16   Ht 5\' 7"  (1.702 m)   Wt 77.1 kg   SpO2 97%   BMI 26.63 kg/m   Physical Exam Vitals and nursing note reviewed.  Constitutional:      General: He is not in acute distress.    Appearance: He is well-developed.     Comments: Somnolent but arousable with painful stimulation.  Not fully compliant with questioning.  Normal respiratory effort without evidence of impending respiratory failure  HENT:     Head: Normocephalic and atraumatic.  Eyes:     Conjunctiva/sclera: Conjunctivae normal.     Pupils: Pupils are equal, round, and reactive to light.  Cardiovascular:     Rate and Rhythm: Normal rate and regular rhythm.     Heart sounds: Normal heart sounds.  Pulmonary:     Effort:  Pulmonary effort is normal. No respiratory distress.     Breath sounds: Normal breath sounds.  Abdominal:     General: There is no distension.     Palpations: Abdomen is soft.     Tenderness: There is no abdominal tenderness.  Musculoskeletal:        General: No deformity. Normal range of motion.     Cervical back: Normal range of motion and neck supple.  Skin:    General: Skin is warm and dry.  Neurological:     General: No focal deficit present.     ED Results / Procedures / Treatments   Labs (all labs ordered are listed, but only abnormal results are displayed) Labs Reviewed  BASIC METABOLIC PANEL  ETHANOL  ACETAMINOPHEN LEVEL   SALICYLATE LEVEL  CBC WITH DIFFERENTIAL/PLATELET  RAPID URINE DRUG SCREEN, HOSP PERFORMED    EKG None  Radiology No results found.  Procedures Procedures   Medications Ordered in ED Medications - No data to display  ED Course  I have reviewed the triage vital signs and the nursing notes.  Pertinent labs & imaging results that were available during my care of the patient were reviewed by me and considered in my medical decision making (see chart for details).    MDM Rules/Calculators/A&P                          MDM  MSE complete  [name] was evaluated in Emergency Department on 04/01/2021 for the symptoms described in the history of present illness. He was evaluated in the context of the global COVID-19 pandemic, which necessitated consideration that the patient might be at risk for infection with the SARS-CoV-2 virus that causes COVID-19. Institutional protocols and algorithms that pertain to the evaluation of patients at risk for COVID-19 are in a state of rapid change based on information released by regulatory bodies including the CDC and federal and state organizations. These policies and algorithms were followed during the patient's care in the ED.  Patient presents following apparent overdose (xanax and fentanyl?). He received Narcan PTA. No reported SI. Patient reportedly was "trying to get high."  He is somnolent on arrival. He is not hypoxic. Respiratory distress not present. Airway reflexes intact.   Screening labs obtained are without significant abnormality.   1050PM - Patient remains somnolent. Groans with painful stimulation. Will require additional observation.   Dr. Eudelia Bunch aware of case and pending disposition.     Final Clinical Impression(s) / ED Diagnoses Final diagnoses:  Drug abuse [facility])    Rx / DC Orders ED Discharge Orders    None       Wynetta Fines, MD 04/03/21 [phone]

## 2021-04-02 NOTE — ED Notes (Signed)
Pt awake and oriented. Offered patient assistance to bathroom, pt states he does not need to go right now.

## 2021-04-02 NOTE — ED Notes (Signed)
Attempted to wake up patient, patient still difficult to arouse

## 2021-04-02 NOTE — ED Notes (Signed)
Pt. Made aware for the need of urine specimen. 

## 2021-04-02 NOTE — ED Provider Notes (Signed)
I assumed care of this patient.  Please see previous provider note for further details of Hx, PE.  Briefly patient is a 25 y.o. male who presented here for Benzo and opiate overdose. HDS. Labs reassuring. Protecting airway. Plan to MTF.    4:56 AM Has MTF. Denies SI.  The patient appears reasonably screened and/or stabilized for discharge and I doubt any other medical condition or other [facility] requiring further screening, evaluation, or treatment in the ED at this time prior to discharge. Safe for discharge with strict return precautions.  Disposition: Discharge  Condition: Good  I have discussed the results, Dx and Tx plan with the patient/family who expressed understanding and agree(s) with the plan. Discharge instructions discussed at length. The patient/family was given strict return precautions who verbalized understanding of the instructions. No further questions at time of discharge.    ED Discharge Orders    None       Follow Up: [facility], [facility] [address] [place] [address] [phone]  Call in 3 days           Shawntell Dixson, Amadeo Garnet, MD 04/02/21 [phone]

## 2021-11-04 IMAGING — US US SCROTUM W/ DOPPLER COMPLETE
1 series · 14 of 25 positions shown · non-contrast
Comparison: None.

CLINICAL DATA: Pain

EXAM:
SCROTAL ULTRASOUND
DOPPLER ULTRASOUND OF THE TESTICLES
TECHNIQUE: Complete ultrasound examination of the testicles, epididymis, and
other scrotal structures was performed. Color and spectral Doppler
ultrasound were also utilized to evaluate blood flow to the
testicles.

[Series 1: us scrotum w/doppler · 14 of 52 slices shown]
[im 1/52]
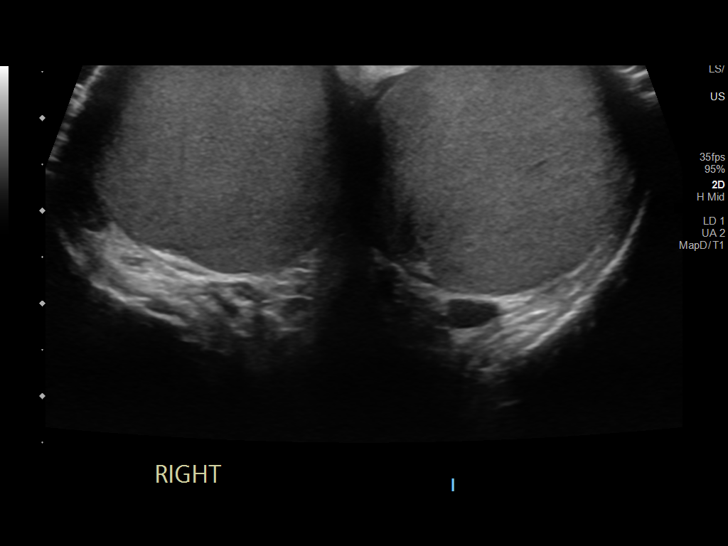
[im 5/52]
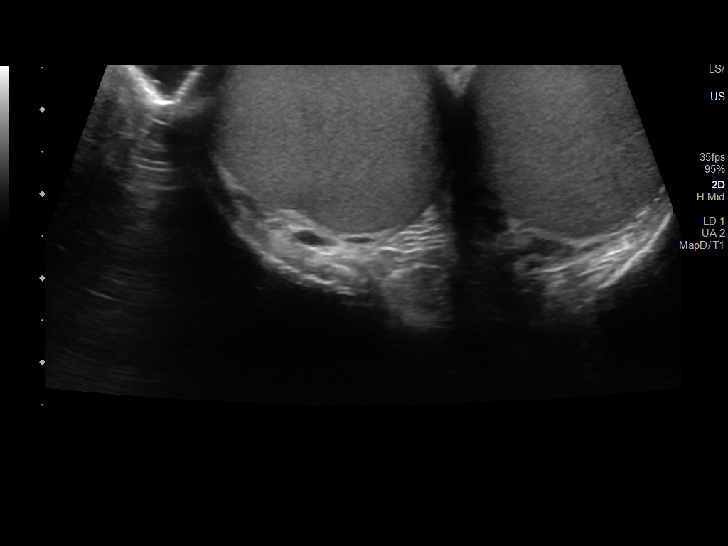
[im 9/52]
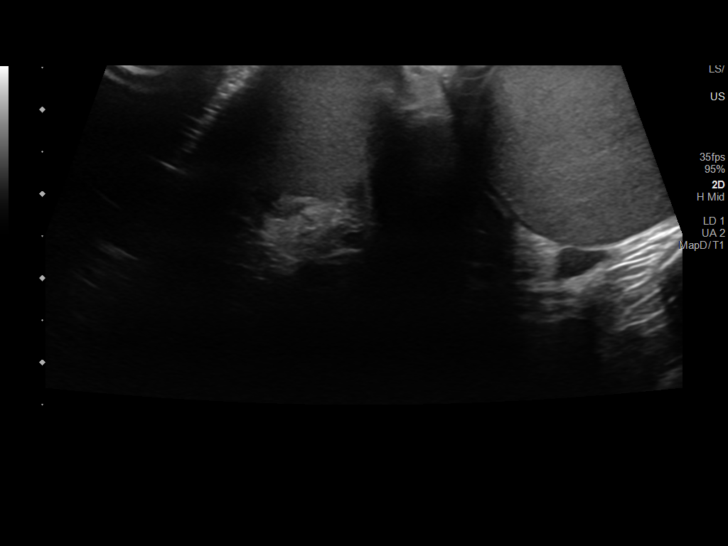
[im 13/52]
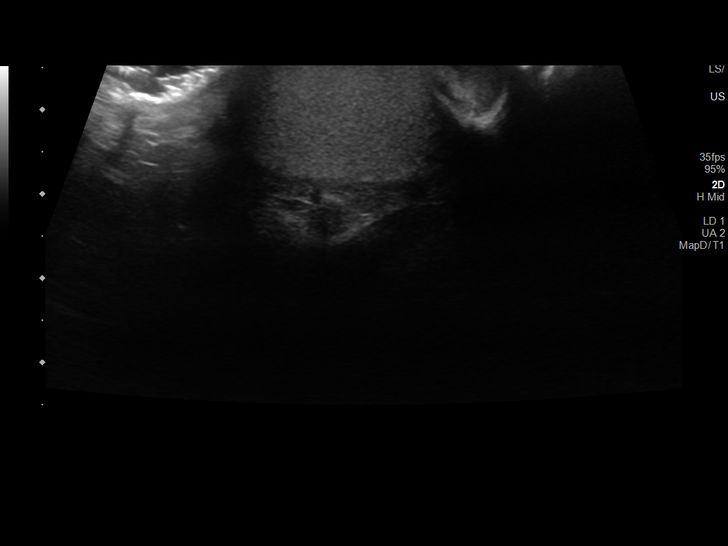
[im 18/52]
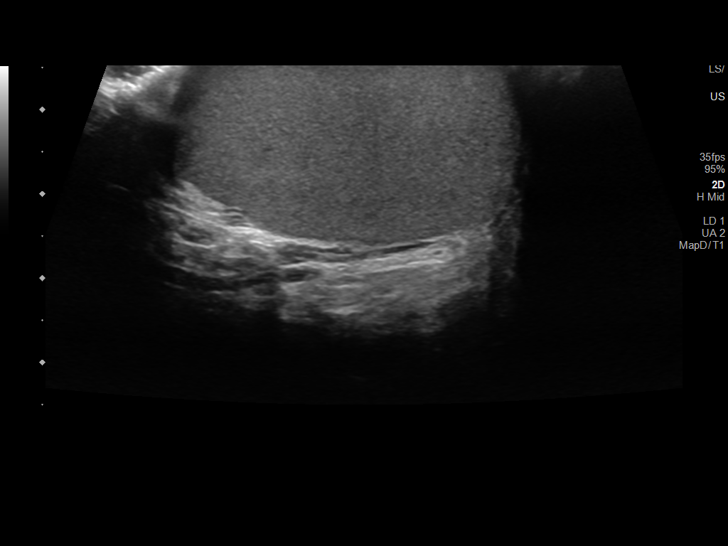
[im 20/52]
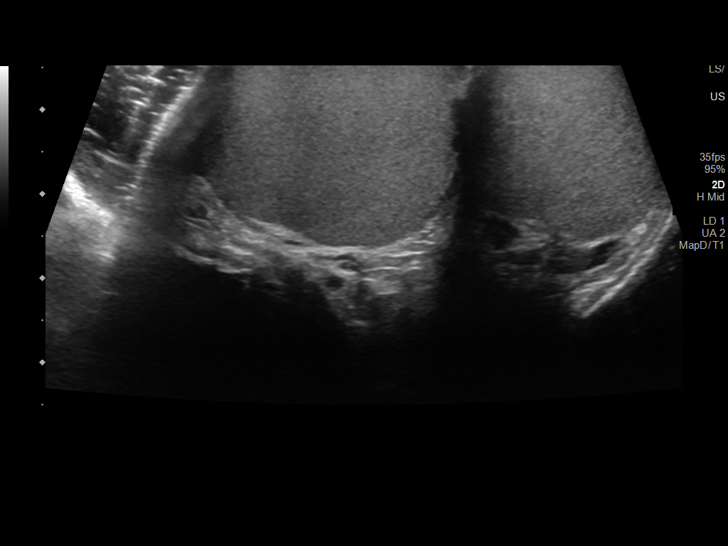
[im 24/52]
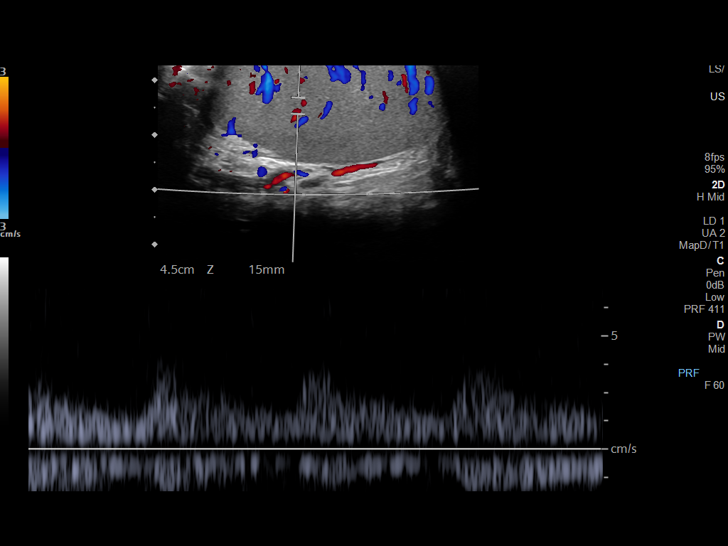
[im 28/52]
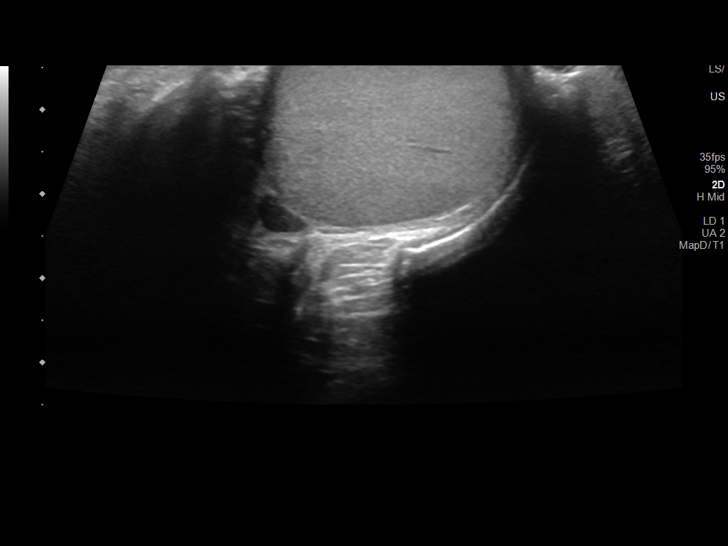
[im 32/52]
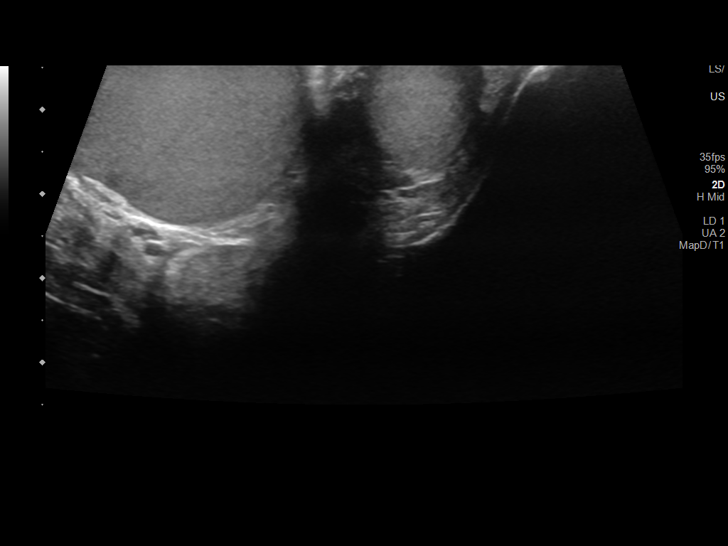
[im 35/52]
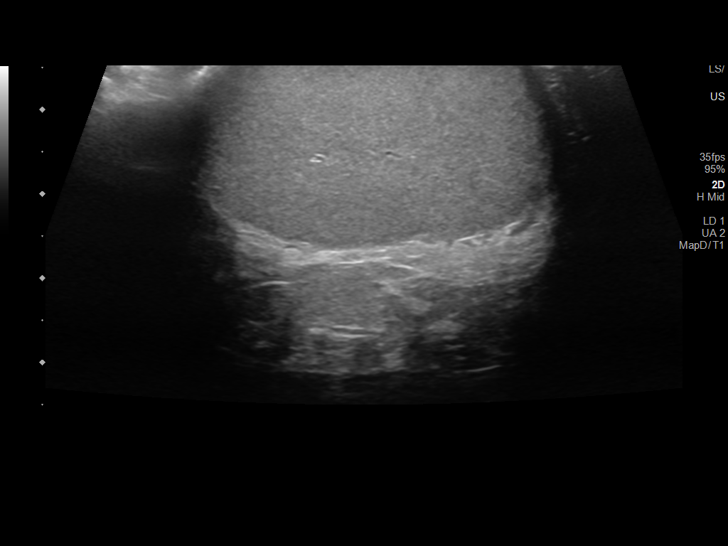
[im 39/52]
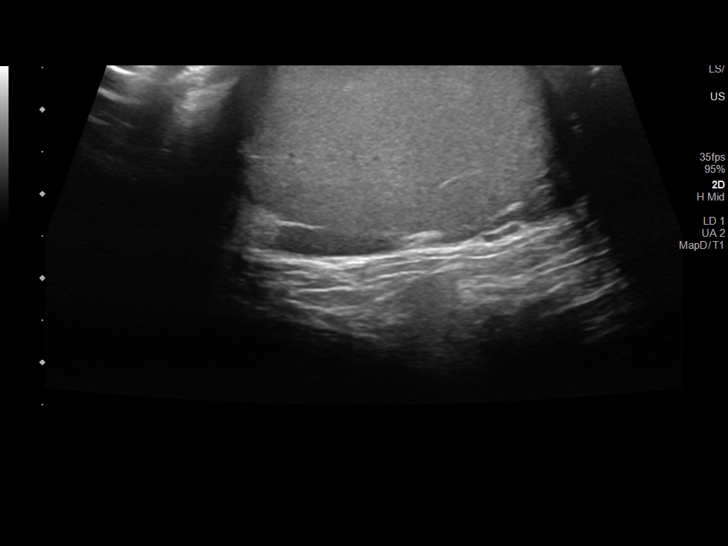
[im 43/52]
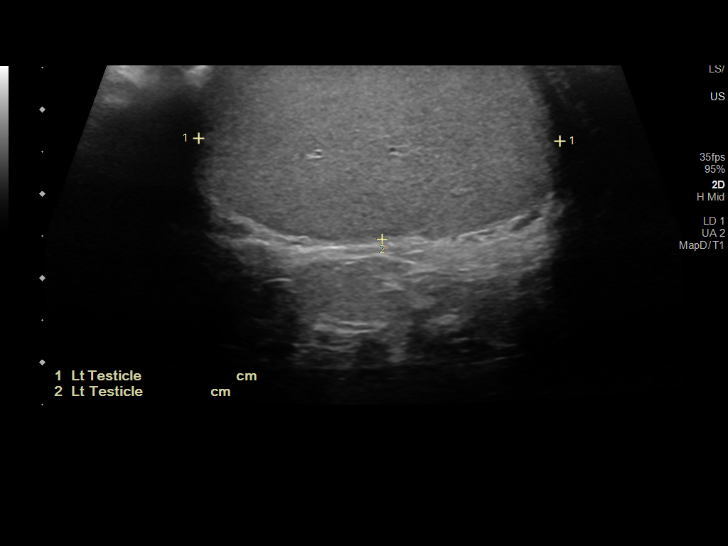
[im 47/52]
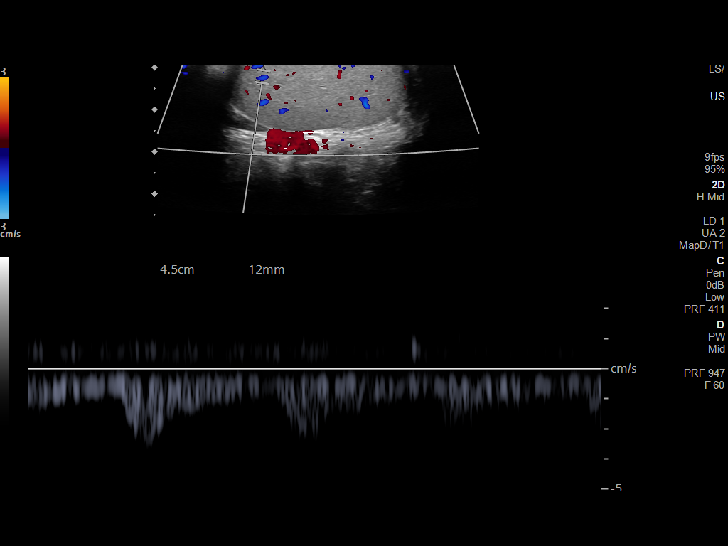
[im 52/52]
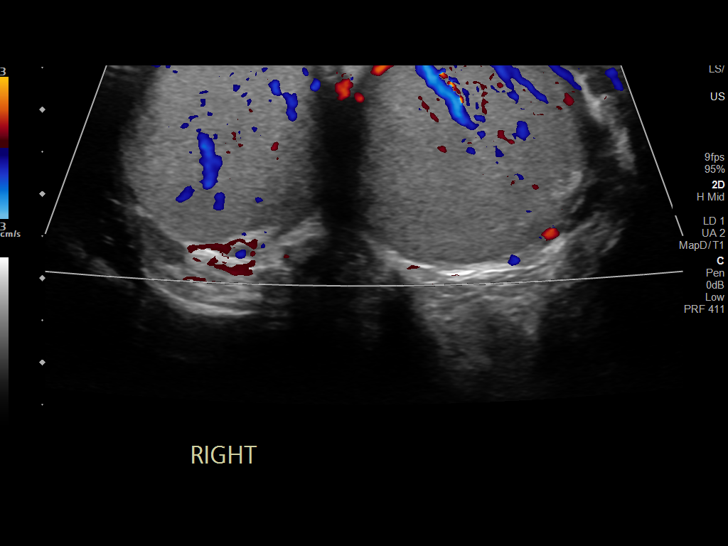

[14 of 25 positions shown; findings below may reference images not displayed]

FINDINGS: Right testicle

Measurements: 4.1 x 2.4 x 2.9 cm. No mass or microlithiasis
visualized.

Left testicle

Measurements: 4.3 x 2.3 x 3.0 cm. No mass or microlithiasis
visualized.

Right epididymis:  Normal in size and appearance.

Left epididymis:  Normal in size and appearance.

Hydrocele:  None visualized.

Varicocele:  None visualized.

Pulsed Doppler interrogation of both testes demonstrates normal low
resistance arterial and venous waveforms bilaterally.
IMPRESSION: 1. Unremarkable scrotal ultrasound.

## 2021-12-04 IMAGING — DX DG CHEST 1V PORT
1 series · 1 of 1 positions shown · non-contrast
Comparison: None.

CLINICAL DATA: Dyspnea

EXAM:
PORTABLE CHEST 1 VIEW

[chest ap]
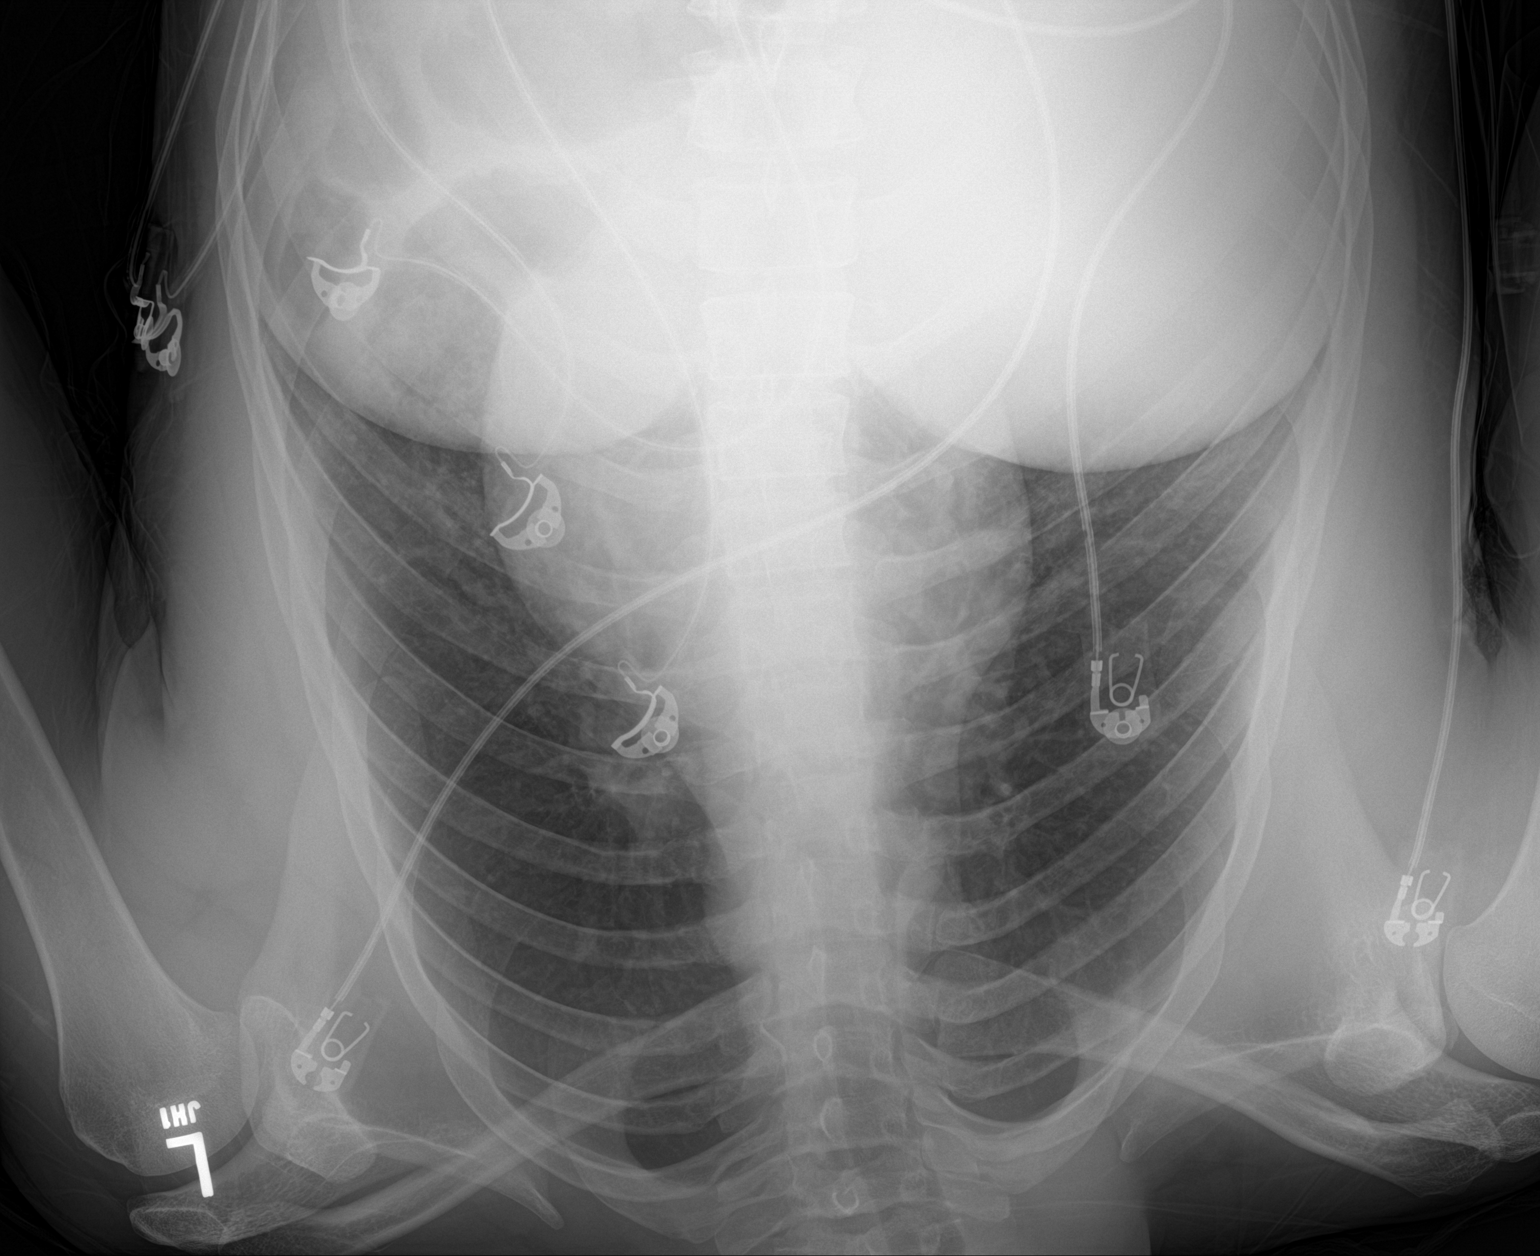

[1 of 1 positions shown; findings below may reference images not displayed]

FINDINGS: The heart size and mediastinal contours are within normal limits.
Both lungs are clear. The visualized skeletal structures are
unremarkable.
IMPRESSION: No active disease.

## 2022-02-17 ENCOUNTER — Other Ambulatory Visit: Payer: Self-pay

## 2022-02-17 ENCOUNTER — Emergency Department (HOSPITAL_COMMUNITY)
Admission: EM | Admit: 2022-02-17 | Discharge: 2022-02-17 | Disposition: A | Discharge status: Home / Self Care | Payer: Medicaid Other | Attending: Emergency Medicine | Admitting: Emergency Medicine

## 2022-02-17 DIAGNOSIS — K047 Periapical abscess without sinus: Secondary | ICD-10-CM

## 2022-02-17 MED ORDER — CLINDAMYCIN HCL 150 MG PO CAPS
450.0000 mg | ORAL_CAPSULE | Freq: Once | ORAL | Status: AC
  Administered 2022-02-17: 450 mg via ORAL
  Filled 2022-02-17: qty 3

## 2022-02-17 MED ORDER — CLINDAMYCIN HCL 150 MG PO CAPS: 450.0000 mg | ORAL_CAPSULE | Freq: Three times a day (TID) | ORAL | 0 refills | Status: AC

## 2022-02-17 NOTE — ED Provider Notes (Signed)
?[facility] EMERGENCY DEPARTMENT ?Provider Note ? ? ?CSN: [id number] ?Arrival date & time: 02/17/22  0745 ? ?  ? ?History ? ?Chief Complaint  ?Patient presents with  ? Facial Swelling  ?  Left cheek  ? ? ?Dominique Calvey is a 26 y.o. male. ? ?The history is provided by the patient.  ?Dental Pain ?Location:  Upper ?Severity:  Mild ?Onset quality:  Gradual ?Timing:  Constant ?Progression:  Worsening ?Chronicity:  New ?Context: dental caries   ?Relieved by:  Nothing ?Worsened by:  Nothing ?Associated symptoms: facial swelling and gum swelling   ?Associated symptoms: no congestion, no difficulty swallowing, no drooling, no facial pain, no fever, no headaches, no neck pain, no neck swelling, no oral bleeding, no oral lesions and no trismus   ?Risk factors: no diabetes   ? ?  ? ?Home Medications ?Prior to Admission medications   ?Medication Sig Start Date End Date Taking? Authorizing Provider  ?clindamycin (CLEOCIN) 150 MG capsule Take 3 capsules (450 mg total) by mouth 3 (three) times daily for 7 days. 02/17/22 02/24/22 Yes Analynn Daum, DO  ?cephALEXin (KEFLEX) 500 MG capsule Take 1 capsule (500 mg total) by mouth 3 (three) times daily. For 7 days ?Patient not taking: Reported on 04/02/2021 03/17/14   Ree Shay, MD  ?diphenhydrAMINE (BENADRYL) 25 MG tablet Take 1 tablet (25 mg total) by mouth every 6 (six) hours. ?Patient not taking: Reported on 04/02/2021 07/08/18   Eyvonne Mechanic, PA-C  ?oxyCODONE-acetaminophen (PERCOCET/ROXICET) 5-325 MG tablet Take 1-2 tablets by mouth every 6 (six) hours as needed for severe pain. ?Patient not taking: Reported on 04/02/2021 08/03/18   Cherly Anderson, PA-C  ?   ? ?Allergies    ?Patient has no known allergies.   ? ?Review of Systems   ?Review of Systems  ?Constitutional:  Negative for fever.  ?HENT:  Positive for facial swelling. Negative for congestion, drooling and mouth sores.   ?Musculoskeletal:  Negative for neck pain.  ?Neurological:  Negative for  headaches.  ? ?Physical Exam ?Updated Vital Signs ?BP 136/77   Pulse 69   Temp 98.4 ?F (36.9 ?C) (Oral)   Resp 12   SpO2 99%  ?Physical Exam ?Constitutional:   ?   General: He is not in acute distress. ?   Appearance: He is not ill-appearing.  ?HENT:  ?   Head: Normocephalic.  ?   Comments: There is some mild fall to the left upper gingiva with some swelling to the left cheek, no trismus, no drooling, no pain with extraocular movements, no swelling around the eye ?   Nose: Nose normal.  ?   Mouth/Throat:  ?   Mouth: Mucous membranes are moist.  ?Eyes:  ?   General:     ?   Right eye: No discharge.     ?   Left eye: No discharge.  ?   Pupils: Pupils are equal, round, and reactive to light.  ?Neurological:  ?   Mental Status: He is alert.  ? ? ?ED Results / Procedures / Treatments   ?Labs ?(all labs ordered are listed, but only abnormal results are displayed) ?Labs Reviewed - No data to display ? ?EKG ?None ? ?Radiology ?No results found. ? ?Procedures ?Procedures  ? ? ?Medications Ordered in ED ?Medications  ?clindamycin (CLEOCIN) capsule 450 mg (has no administration in time range)  ? ? ?ED Course/ Medical Decision Making/ A&P ?  ?                        ?  Medical Decision Making ?Risk ?Prescription drug management. ? ? ?Izan Miron Pescador is here with dental problem.  Normal vitals.  No fever.  He appears to have possibly developing dental infection.  Possibly periapical infection some mild swelling to the gum, left side of the cheek.  There is no pain with extraocular movement, there is no major swelling to the side of the face/periorbital space.  Does not appear to have any systemic symptoms of infection.  We will start patient on antibiotics and have him follow-up with dentistry for possible tooth extraction, further care. He understands return precautions.  Discharged in good condition. ? ?This chart was dictated using voice recognition software.  Despite best efforts to proofread,  errors can occur which  can change the documentation meaning.  ? ? ? ? ? ? ? ?Final Clinical Impression(s) / ED Diagnoses ?Final diagnoses:  ?Dental infection  ? ? ?Rx / DC Orders ?ED Discharge Orders   ? ?      Ordered  ?  clindamycin (CLEOCIN) 150 MG capsule  3 times daily       ? 02/17/22 0758  ? ?  ?  ? ?  ? ? ?  ?Virgina Norfolk, DO ?02/17/22 0802 ? ?

## 2022-02-17 NOTE — ED Triage Notes (Signed)
Patient coming from home, complaint facial swelling (left cheek). VSS. ?

## 2022-02-17 NOTE — Discharge Instructions (Addendum)
Take antibiotic as prescribed, call dentistry this morning and say you were seen in the ED for dental infection/likely dental abscess and reffered. Please return if symptoms are worsening such as swelling making it difficult to open mouth or swelling making painful eye movements.  ?

## 2022-09-06 ENCOUNTER — Emergency Department (HOSPITAL_COMMUNITY)
Admission: EM | Admit: 2022-09-06 | Discharge: 2022-09-07 | Disposition: A | Discharge status: Home / Self Care | Payer: Medicaid Other | Attending: Emergency Medicine | Admitting: Emergency Medicine

## 2022-09-06 ENCOUNTER — Other Ambulatory Visit: Payer: Self-pay

## 2022-09-06 ENCOUNTER — Encounter (HOSPITAL_COMMUNITY): Payer: Self-pay | Admitting: Emergency Medicine

## 2022-09-06 DIAGNOSIS — R6884 Jaw pain: Secondary | ICD-10-CM | POA: Insufficient documentation

## 2022-09-06 MED ORDER — IBUPROFEN 400 MG PO TABS
600.0000 mg | ORAL_TABLET | Freq: Once | ORAL | Status: AC
  Administered 2022-09-07: 600 mg via ORAL
  Filled 2022-09-06: qty 1
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
## 2022-09-07 ENCOUNTER — Emergency Department (HOSPITAL_COMMUNITY): Payer: Medicaid Other
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
